# Patient Record
Sex: Female | Born: 1990 | Race: Black or African American | Hispanic: No | Marital: Single | Smoking: Former smoker
Health system: Southern US, Community
[De-identification: ages and names within clinical notes are randomized; demographics above are authoritative.]

## PROBLEM LIST (undated history)

## (undated) HISTORY — PX: FOOT SURGERY: SHX648

## (undated) HISTORY — PX: OTHER SURGICAL HISTORY: SHX169

---

## 2016-01-30 ENCOUNTER — Emergency Department (HOSPITAL_COMMUNITY)
Admission: EM | Admit: 2016-01-30 | Discharge: 2016-01-30 | Disposition: A | Discharge status: Home / Self Care | Payer: BLUE CROSS/BLUE SHIELD | Attending: Emergency Medicine | Admitting: Emergency Medicine

## 2016-01-30 ENCOUNTER — Emergency Department (HOSPITAL_COMMUNITY): Payer: BLUE CROSS/BLUE SHIELD

## 2016-01-30 ENCOUNTER — Encounter (HOSPITAL_COMMUNITY): Payer: Self-pay | Admitting: *Deleted

## 2016-01-30 DIAGNOSIS — F17290 Nicotine dependence, other tobacco product, uncomplicated: Secondary | ICD-10-CM | POA: Insufficient documentation

## 2016-01-30 DIAGNOSIS — N83201 Unspecified ovarian cyst, right side: Secondary | ICD-10-CM | POA: Insufficient documentation

## 2016-01-30 DIAGNOSIS — R300 Dysuria: Secondary | ICD-10-CM | POA: Insufficient documentation

## 2016-01-30 DIAGNOSIS — R1031 Right lower quadrant pain: Secondary | ICD-10-CM

## 2016-01-30 DIAGNOSIS — R109 Unspecified abdominal pain: Secondary | ICD-10-CM

## 2016-01-30 LAB — URINALYSIS, ROUTINE W REFLEX MICROSCOPIC
Bilirubin Urine: NEGATIVE
Glucose, UA: NEGATIVE mg/dL
HGB URINE DIPSTICK: NEGATIVE
Ketones, ur: NEGATIVE mg/dL
Leukocytes, UA: NEGATIVE
NITRITE: NEGATIVE
PROTEIN: NEGATIVE mg/dL
Specific Gravity, Urine: 1.015 (ref 1.005–1.030)
pH: 6.5 (ref 5.0–8.0)

## 2016-01-30 LAB — COMPREHENSIVE METABOLIC PANEL
ALK PHOS: 47 U/L (ref 38–126)
ALT: 11 U/L — AB (ref 14–54)
AST: 16 U/L (ref 15–41)
Albumin: 4.1 g/dL (ref 3.5–5.0)
Anion gap: 5 (ref 5–15)
BILIRUBIN TOTAL: 0.5 mg/dL (ref 0.3–1.2)
BUN: 11 mg/dL (ref 6–20)
CALCIUM: 9.5 mg/dL (ref 8.9–10.3)
CO2: 25 mmol/L (ref 22–32)
CREATININE: 0.71 mg/dL (ref 0.44–1.00)
Chloride: 108 mmol/L (ref 101–111)
Glucose, Bld: 99 mg/dL (ref 65–99)
Potassium: 4 mmol/L (ref 3.5–5.1)
Sodium: 138 mmol/L (ref 135–145)
TOTAL PROTEIN: 7 g/dL (ref 6.5–8.1)

## 2016-01-30 LAB — LIPASE, BLOOD: Lipase: 25 U/L (ref 11–51)

## 2016-01-30 LAB — CBC
HCT: 38.4 % (ref 36.0–46.0)
Hemoglobin: 13 g/dL (ref 12.0–15.0)
MCH: 30.7 pg (ref 26.0–34.0)
MCHC: 33.9 g/dL (ref 30.0–36.0)
MCV: 90.8 fL (ref 78.0–100.0)
PLATELETS: 174 10*3/uL (ref 150–400)
RBC: 4.23 MIL/uL (ref 3.87–5.11)
RDW: 13.5 % (ref 11.5–15.5)
WBC: 7.4 10*3/uL (ref 4.0–10.5)

## 2016-01-30 LAB — POC URINE PREG, ED: PREG TEST UR: NEGATIVE

## 2016-01-30 LAB — I-STAT CG4 LACTIC ACID, ED
Lactic Acid, Venous: 0.5 mmol/L (ref 0.5–1.9)
Lactic Acid, Venous: <0.3 mmol/L — ABNORMAL LOW (ref 0.5–1.9)

## 2016-01-30 MED ORDER — TRAMADOL HCL 50 MG PO TABS
50.0000 mg | ORAL_TABLET | Freq: Four times a day (QID) | ORAL | 0 refills | Status: DC | PRN
Start: 2016-01-30 — End: 2016-03-28

## 2016-01-30 MED ORDER — FENTANYL CITRATE (PF) 100 MCG/2ML IJ SOLN
50.0000 ug | Freq: Once | INTRAMUSCULAR | Status: AC
  Administered 2016-01-30: 50 ug via INTRAVENOUS
  Filled 2016-01-30: qty 2

## 2016-01-30 MED ORDER — IOPAMIDOL (ISOVUE-300) INJECTION 61%
100.0000 mL | Freq: Once | INTRAVENOUS | Status: AC | PRN
  Administered 2016-01-30: 80 mL via INTRAVENOUS

## 2016-01-30 MED ORDER — SODIUM CHLORIDE 0.9 % IV BOLUS (SEPSIS)
1000.0000 mL | Freq: Once | INTRAVENOUS | Status: AC
  Administered 2016-01-30: 1000 mL via INTRAVENOUS

## 2016-01-30 MED ORDER — IOPAMIDOL (ISOVUE-300) INJECTION 61%
30.0000 mL | Freq: Once | INTRAVENOUS | Status: AC | PRN
  Administered 2016-01-30: 30 mL via ORAL

## 2016-01-30 NOTE — Discharge Instructions (Signed)
Please follow-up with your OB/GYN team for further management of your ovarian cyst. Please follow-up with PCP for further pain management. If symptoms worsen, or he develop any allergic reaction to medications, please return to the nearest ED.

## 2016-01-30 NOTE — ED Notes (Signed)
Pt c/o lower abdominal pain starting about 1 hour ago, also reports rectal pain, denies n/v/d, no fever

## 2016-01-30 NOTE — ED Provider Notes (Signed)
[facility]-EMERGENCY DEPT Provider Note   CSN: [id number] Arrival date & time: 01/30/16  [id number]     History   Chief Complaint Chief Complaint  Patient presents with  . Abdominal Pain    HPI Mylinda LatinaHailey Kearse is a 25 y.o. female with no significant past medical history who presents with abdominal pain. Patient reports that she was awoken this morning approximate 1 hour prior to arrival with lower abdominal pain. She says the pain is worse in the right lower quadrant. She has no history of appendicitis or prior abdominal surgery. She reports feeling normal last night going to bed. She had a normal bowel movement last night. She does report some dysuria but denies any change in appearance, smell, or amount of urine. She denies any recent abdominal trauma. She denies any constipation, diarrhea, or rectal bleeding. She denies any vaginal discharge or vaginal bleeding. She denies any nausea, vomiting, fevers, or chills. She denies any chest pain, shortness of breath, cough, or neurologic planes. She has not taken any medicine to help her symptoms. She describes the pain as 7 out of 10, fluctuating in severity up to 10 out of 10, and improved with a bent over position. She denies any recent rectal trauma or any other complaints today.    The history is provided by the patient and a significant other. No language interpreter was used.  Abdominal Pain   This is a new problem. The current episode started less than 1 hour ago. The problem occurs constantly. The problem has not changed since onset.The pain is located in the RLQ and suprapubic region. The quality of the pain is cramping and sharp. The pain is at a severity of 7/10. The pain is severe. Associated symptoms include dysuria. Pertinent negatives include fever, diarrhea, nausea, vomiting, constipation, frequency, hematuria and headaches. The symptoms are aggravated by palpation. The symptoms are relieved by certain positions. Past workup does not include  surgery. Her past medical history does not include PUD, gallstones, GERD, ulcerative colitis, Crohn's disease or irritable bowel syndrome.    History reviewed. No pertinent past medical history.  There are no active problems to display for this patient.   Past Surgical History:  Procedure Laterality Date  . FOOT SURGERY    . skin grafts      OB History    No data available       Home Medications    Prior to Admission medications   Not on File    Family History No family history on file.  Social History Social History  Substance Use Topics  . Smoking status: Current Every Day Smoker    Types: Cigars  . Smokeless tobacco: Never Used  . Alcohol use Yes     Allergies   Oxycontin [oxycodone hcl]   Review of Systems Review of Systems  Constitutional: Negative for activity change, chills, diaphoresis, fatigue and fever.  HENT: Negative for congestion and rhinorrhea.   Eyes: Negative for visual disturbance.  Respiratory: Negative for cough, chest tightness, shortness of breath and stridor.   Cardiovascular: Negative for chest pain, palpitations and leg swelling.  Gastrointestinal: Positive for abdominal pain. Negative for abdominal distention, blood in stool, constipation, diarrhea, nausea and vomiting.  Genitourinary: Positive for dysuria. Negative for decreased urine volume, difficulty urinating, flank pain, frequency, hematuria, menstrual problem, pelvic pain, vaginal bleeding, vaginal discharge and vaginal pain.  Musculoskeletal: Negative for back pain, neck pain and neck stiffness.  Skin: Negative for rash and wound.  Neurological: Negative for dizziness, weakness,  light-headedness, numbness and headaches.  Psychiatric/Behavioral: Negative for agitation and confusion.  All other systems reviewed and are negative.    Physical Exam Updated Vital Signs BP 113/71 (BP Location: Left Arm)   Pulse 81   Temp 98.7 F (37.1 C)   Resp 16   Ht 5\' 3"  (1.6 m)   Wt  110 lb (49.9 kg)   LMP 01/06/2016 (Approximate)   SpO2 100%   BMI 19.49 kg/m   Physical Exam  Constitutional: She appears well-developed and well-nourished. No distress.  HENT:  Head: Normocephalic and atraumatic.  Nose: Nose normal.  Mouth/Throat: Oropharynx is clear and moist. No oropharyngeal exudate.  Eyes: Conjunctivae are normal. Pupils are equal, round, and reactive to light. No scleral icterus.  Neck: Normal range of motion. Neck supple.  Cardiovascular: Normal rate and regular rhythm.   No murmur heard. Pulmonary/Chest: Effort normal and breath sounds normal. No stridor. No respiratory distress. She has no rales. She exhibits no tenderness.  Abdominal: Soft. Normal appearance and bowel sounds are normal. There is tenderness in the right lower quadrant and suprapubic area. There is no rigidity, no rebound and no CVA tenderness.    Genitourinary: Rectal exam shows no external hemorrhoid, no tenderness and anal tone normal. Pelvic exam was performed with patient in the knee-chest position.  Musculoskeletal: She exhibits no edema or tenderness.  Neurological: She is alert. She exhibits normal muscle tone.  Skin: Skin is warm and dry. Capillary refill takes less than 2 seconds.  Psychiatric: She has a normal mood and affect.  Nursing note and vitals reviewed.    ED Treatments / Results  Labs (all labs ordered are listed, but only abnormal results are displayed) Labs Reviewed  COMPREHENSIVE METABOLIC PANEL - Abnormal; Notable for the following:       Result Value   ALT 11 (*)    All other components within normal limits  I-STAT CG4 LACTIC ACID, ED - Abnormal; Notable for the following:    Lactic Acid, Venous <0.30 (*)    All other components within normal limits  LIPASE, BLOOD  CBC  URINALYSIS, ROUTINE W REFLEX MICROSCOPIC (NOT [facility])  POC URINE PREG, ED  I-STAT CG4 LACTIC ACID, ED    EKG  EKG Interpretation None       Radiology [place] Transvaginal  Non-ob  Result Date: 01/30/2016 CLINICAL DATA:  Ultrasound was provided for use by the ordering physician, and a technical charge was applied by the performing facility.  No radiologist interpretation/professional services rendered.   [place] Pelvis Complete  Result Date: 01/30/2016 CLINICAL DATA:  Abdominal pain. EXAM: TRANSABDOMINAL AND TRANSVAGINAL ULTRASOUND OF PELVIS TECHNIQUE: Both transabdominal and transvaginal ultrasound examinations of the pelvis were performed. Transabdominal technique was performed for global imaging of the pelvis including uterus, ovaries, adnexal regions, and pelvic cul-de-sac. It was necessary to proceed with endovaginal exam following the transabdominal exam to visualize the endometrium and ovaries. COMPARISON:  01/30/2016 CT abdomen FINDINGS: Uterus Measurements: 8 x 3.6 x 4.5 cm. No fibroids or other mass visualized. Endometrium Thickness: 9.6 mm.  No focal abnormality visualized. Right ovary Measurements: 3.8 x 2.6 x 3.8 cm. Complex hypoechoic 2.5 x 2.2 x 2.1 cm mass in the right ovary without internal Doppler flow likely reflecting a hemorrhagic cyst or endometrioma. Left ovary Measurements: 4.2 x 1.4 x 3.1 cm. Normal appearance/no adnexal mass. Other findings Trace pelvic free fluid likely physiologic. IMPRESSION: 1. Complex hypoechoic 2.5 x 2.2 x 2.1 cm mass in the right ovary without internal Doppler  flow likely reflecting a hemorrhagic cyst or endometrioma. Electronically Signed   By: Elige Ko   On: 01/30/2016 10:55   Ct Abdomen Pelvis W Contrast  Result Date: 01/30/2016 CLINICAL DATA:  Acute lower abdominal pain. EXAM: CT ABDOMEN AND PELVIS WITH CONTRAST TECHNIQUE: Multidetector CT imaging of the abdomen and pelvis was performed using the standard protocol following bolus administration of intravenous contrast. CONTRAST:  80mL ISOVUE-300 IOPAMIDOL (ISOVUE-300) INJECTION 61% COMPARISON:  None. FINDINGS: Lower chest: Visualized lung bases are unremarkable.  Hepatobiliary: No gallstones are noted.  Liver appears normal. Pancreas: Normal. Spleen: Normal. Adrenals/Urinary Tract: Adrenal glands and kidneys appear normal. No hydronephrosis or renal obstruction is noted. Urinary bladder appears normal. Stomach/Bowel: The appendix appears normal. There is no evidence of bowel obstruction. Vascular/Lymphatic: No significant adenopathy is noted. Reproductive: Uterus and left ovary appear normal. 2 cm peripherally enhancing irregular cyst is noted in right ovary most consistent with ruptured or involuting cyst. Other: Mild amount of free fluid is noted in the right adnexal region and pelvis consistent with ruptured ovarian cyst. There may be some hemorrhage present in the right adnexal region as well. Musculoskeletal: No significant osseous abnormality is noted. IMPRESSION: Probable ruptured or involuting cyst seen in right ovary. Mild amount of free fluid is noted in the right adnexal region and pelvis, as well as possible small amount of hemorrhage present in the right adnexal region. Pelvic ultrasound is recommended for further evaluation. Electronically Signed   By: Lupita Raider, M.D.   On: 01/30/2016 09:36    Procedures Procedures (including critical care time)  Medications Ordered in ED Medications  fentaNYL (SUBLIMAZE) injection 50 mcg (50 mcg Intravenous Given 01/30/16 0747)  sodium chloride 0.9 % bolus 1,000 mL (0 mLs Intravenous Stopped 01/30/16 0837)  iopamidol (ISOVUE-300) 61 % injection 30 mL (30 mLs Oral Contrast Given 01/30/16 0727)  iopamidol (ISOVUE-300) 61 % injection 100 mL (80 mLs Intravenous Contrast Given 01/30/16 0908)     Initial Impression / Assessment and Plan / ED Course  I have reviewed the triage vital signs and the nursing notes.  Pertinent labs & imaging results that were available during my care of the patient were reviewed by me and considered in my medical decision making (see chart for details).  Clinical Course  Value  Comment By Time  [facility]: 30.7 (Reviewed) Canary Brim Tegeler, MD 11/01 [phone]    Janara Klett is a 25 y.o. female with no significant past medical history who presents with abdominal pain.   History and exam are seen above.  Patient's abdominal exam significant for right lower quadrant tenderness and mild suprapubic tenderness. No back tenderness or CVA tenderness. Lungs clear. Rectal exam shows no abscess or hemorrhoid. Patient denies any pelvic pain.  Due to the location of symptoms in the right lower quadrant, patient will have workup including urinalysis, pregnancy test, blood work, and CT scan to look for appendicitis or other intra-abdominal etiology of her pain. Patient will be given fentanyl due to OxyContin intolerance. Patient we given fluids for hydration.  9:54 AM Laboratory testing results showed negative pregnancy test, unremarkable urinalysis, normal lactic acid, normal lipase, unremarkable CMP or CBC.  CT scan showed evidence of right ovarian cyst with possible hemorrhage. Pelvic ultrasound was recommended by radiology. This was subsequently ordered.  Patient was informed of findings and agrees to ultrasound. Patient's pain is improved after medications.   Pelvic ultrasound revealed confirmation of likely ruptured cyst versus endometriosis. Patient informed of these findings and instructed to  follow-up with her OB/GYN team. She reports she will call to try and see them this week. As patient has had complete resolution of symptoms, and with patient's reassuring vital signs and laboratory testing, patient felt appropriate for discharge.   Patient given prescription for pain medicine. Patient given strict return precautions for any new or worsening symptoms. Patient had no other questions or concerns and was discharged in good condition.    Final Clinical Impressions(s) / ED Diagnoses   Final diagnoses:  Abdominal pain  Right lower quadrant abdominal pain  Hemorrhagic cyst  of right ovary    New Prescriptions Discharge Medication List as of 01/30/2016 12:07 PM    START taking these medications   Details  traMADol (ULTRAM) 50 MG tablet Take 1 tablet (50 mg total) by mouth every 6 (six) hours as needed., Starting Wed 01/30/2016, Print        Clinical Impression: 1. Right lower quadrant abdominal pain   2. Abdominal pain   3. Hemorrhagic cyst of right ovary     Disposition: Discharge  Condition: Good  I have discussed the results, Dx and Tx plan with the pt(& family if present). He/she/they expressed understanding and agree(s) with the plan. Discharge instructions discussed at great length. Strict return precautions discussed and pt &/or family have verbalized understanding of the instructions. No further questions at time of discharge.    Discharge Medication List as of 01/30/2016 12:07 PM    START taking these medications   Details  traMADol (ULTRAM) 50 MG tablet Take 1 tablet (50 mg total) by mouth every 6 (six) hours as needed., Starting Wed 01/30/2016, Print        Follow Up: [facility] [address] [place] [address] [phone] Schedule an appointment as soon as possible for a visit    [facility] [address] [phone] [address]c [place] [address] [phone]  If symptoms worsen     Heide Scales, MD 01/30/16 1909

## 2016-01-30 NOTE — ED Notes (Signed)
Patient transported to CT 

## 2016-03-28 ENCOUNTER — Emergency Department (HOSPITAL_COMMUNITY)
Admission: EM | Admit: 2016-03-28 | Discharge: 2016-03-28 | Disposition: A | Discharge status: Home / Self Care | Payer: Self-pay | Attending: Emergency Medicine | Admitting: Emergency Medicine

## 2016-03-28 ENCOUNTER — Encounter (HOSPITAL_COMMUNITY): Payer: Self-pay | Admitting: Emergency Medicine

## 2016-03-28 DIAGNOSIS — Z79899 Other long term (current) drug therapy: Secondary | ICD-10-CM | POA: Insufficient documentation

## 2016-03-28 DIAGNOSIS — Z3A01 Less than 8 weeks gestation of pregnancy: Secondary | ICD-10-CM | POA: Insufficient documentation

## 2016-03-28 DIAGNOSIS — F17200 Nicotine dependence, unspecified, uncomplicated: Secondary | ICD-10-CM | POA: Insufficient documentation

## 2016-03-28 DIAGNOSIS — O99331 Smoking (tobacco) complicating pregnancy, first trimester: Secondary | ICD-10-CM | POA: Insufficient documentation

## 2016-03-28 DIAGNOSIS — J069 Acute upper respiratory infection, unspecified: Secondary | ICD-10-CM | POA: Insufficient documentation

## 2016-03-28 DIAGNOSIS — O99511 Diseases of the respiratory system complicating pregnancy, first trimester: Secondary | ICD-10-CM | POA: Insufficient documentation

## 2016-03-28 DIAGNOSIS — O0281 Inappropriate change in quantitative human chorionic gonadotropin (hCG) in early pregnancy: Secondary | ICD-10-CM | POA: Insufficient documentation

## 2016-03-28 LAB — HCG, QUANTITATIVE, PREGNANCY: HCG, BETA CHAIN, QUANT, S: 29553 m[IU]/mL — AB (ref <5)

## 2016-03-28 MED ORDER — CONCEPT OB 130-92.4-1 MG PO CAPS: 1.0000 | ORAL_CAPSULE | Freq: Every day | ORAL | 12 refills | Status: AC

## 2016-03-28 MED ORDER — ACETAMINOPHEN 500 MG PO TABS
1000.0000 mg | ORAL_TABLET | Freq: Once | ORAL | Status: AC
  Administered 2016-03-28: 1000 mg via ORAL
  Filled 2016-03-28: qty 2

## 2016-03-28 NOTE — Discharge Instructions (Signed)
Your Pregnancy test is positive. You are estimated top be 8565w4d pregnant. Your estimated due date is 11/10/2016.  Please take prenatal vitamins. Avoid cigarettes and alcohol. You may take tylenol only for your cold symptoms.  You may ask a pharmacist at any drug store if there are other medications that are safe for you to take for your cold in early pregnancy.  You appear to have an upper respiratory infection (URI). An upper respiratory tract infection, or cold, is a viral infection of the air passages leading to the lungs. It is contagious and can be spread to others, especially during the first 3 or 4 days. It cannot be cured by antibiotics or other medicines. RETURN IMMEDIATELY IF you develop shortness of breath, confusion or altered mental status, a new rash, become dizzy, faint, or poorly responsive, or are unable to be cared for at home.

## 2016-03-28 NOTE — ED Provider Notes (Signed)
[facility]-EMERGENCY DEPT Provider Note   CSN: [id number] Arrival date & time: 03/28/16  0746     History   Chief Complaint Chief Complaint  Patient presents with  . Generalized Body Aches  . Possible Pregnancy    HPI Lindsey Doyle is a 25 y.o. female who presents to the ED with Symptoms of URI and concern for possible pregnancy. The patient's last menstrual period was 02/04/2016. The patient states that she did have a positive home pregnancy test. Patient also complains of onset of symptoms of URI yesterday. She developed a scratchy throat, cough, body aches. She states that she felt subjectively febrile last night and took over-the-counter pain relieving medication which helped. She denies wheezing, chest tightness.  HPI  History reviewed. No pertinent past medical history.  There are no active problems to display for this patient.   Past Surgical History:  Procedure Laterality Date  . FOOT SURGERY    . skin grafts      OB History    No data available       Home Medications    Prior to Admission medications   Medication Sig Start Date End Date Taking? Authorizing Provider  ORSYTHIA 0.1-20 MG-MCG tablet Take 1 tablet by mouth daily. 12/25/15   Historical Provider, MD  traMADol (ULTRAM) 50 MG tablet Take 1 tablet (50 mg total) by mouth every 6 (six) hours as needed. 01/30/16   Heide Scaleshristopher J Tegeler, MD    Family History No family history on file.  Social History Social History  Substance Use Topics  . Smoking status: Current Some Day Smoker  . Smokeless tobacco: Never Used  . Alcohol use No     Allergies   Oxycontin [oxycodone hcl]   Review of Systems Review of Systems  Ten systems reviewed and are negative for acute change, except as noted in the HPI.   Physical Exam Updated Vital Signs BP 117/73 (BP Location: Left Arm)   Pulse 103   Temp 98.8 F (37.1 C) (Oral)   Resp 18   Wt 49.9 kg   LMP 02/04/2016   SpO2 100%   BMI 19.49 kg/m   Physical  Exam  Constitutional: She is oriented to person, place, and time. She appears well-developed and well-nourished. No distress.  HENT:  Head: Normocephalic and atraumatic.  Mild pharyngeal erythema without exudate Nasal and facial congestion TMs normal bilaterally. No tonsillar or cervical adenopathy  Eyes: Conjunctivae are normal. No scleral icterus.  Neck: Normal range of motion.  Cardiovascular: Normal rate, regular rhythm and normal heart sounds.  Exam reveals no gallop and no friction rub.   No murmur heard. Pulmonary/Chest: Effort normal and breath sounds normal. No respiratory distress. She has no wheezes.  Dry cough  Abdominal: Soft. Bowel sounds are normal. She exhibits no distension and no mass. There is no tenderness. There is no guarding.  Neurological: She is alert and oriented to person, place, and time.  Skin: Skin is warm and dry. She is not diaphoretic.  Nursing note and vitals reviewed.       ED Treatments / Results  Labs (all labs ordered are listed, but only abnormal results are displayed) Labs Reviewed  HCG, QUANTITATIVE, PREGNANCY    EKG  EKG Interpretation None       Radiology No results found.  Procedures Procedures (including critical care time)  Medications Ordered in ED Medications  acetaminophen (TYLENOL) tablet 1,000 mg (not administered)     Initial Impression / Assessment and Plan / ED Course  I have reviewed the triage vital signs and the nursing notes.  Pertinent labs & imaging results that were available during my care of the patient were reviewed by me and considered in my medical decision making (see chart for details).  Clinical Course as of Mar 28 1042  Fri Mar 28, 2016  1043 HCG, Newman NickelsBeta Chain, Quant, S: (!) [phone] [AH]    Clinical Course User Index [AH] Arthor CaptainAbigail Jhaniya Briski, PA-C    Patient with symptoms of URI. She will use Tylenol for symptom treatment. Her quantitative hCG is 30,000. She had this and estimated gestational  age of [redacted] weeks 4 days with an estimated delivery date 11/10/2016. Patient given a prescription for prenatal vitamins. Advised to avoid alcohol, ATC medications without consultation. A pharmacist, drugs or cigarettes. The patient appears safe for discharge at this time.  Final Clinical Impressions(s) / ED Diagnoses   Final diagnoses:  Less than [redacted] weeks gestation of pregnancy  Viral upper respiratory tract infection    New Prescriptions Discharge Medication List as of 03/28/2016 10:51 AM    START taking these medications   Details  Prenat w/o A Vit-FeFum-FePo-FA (CONCEPT OB) 130-92.4-1 MG CAPS Take 1 capsule by mouth daily., Starting Fri 03/28/2016, Print         Arthor CaptainAbigail Breunna Nordmann, PA-C 03/28/16 1638    Rolland PorterMark James, MD 04/04/16 0400

## 2016-03-28 NOTE — ED Triage Notes (Signed)
Pt complaint of non productive cough, sore throat, nausea, and generalized body aches. Pt continues to report "positive pregnancy test on a stick" but does not have OBGYN related to insurance problems.

## 2016-04-02 ENCOUNTER — Encounter (HOSPITAL_COMMUNITY): Payer: Self-pay | Admitting: Emergency Medicine

## 2016-04-02 ENCOUNTER — Emergency Department (HOSPITAL_COMMUNITY)
Admission: EM | Admit: 2016-04-02 | Discharge: 2016-04-03 | Disposition: A | Discharge status: Home / Self Care | Payer: Medicaid Other | Attending: Emergency Medicine | Admitting: Emergency Medicine

## 2016-04-02 ENCOUNTER — Emergency Department (HOSPITAL_COMMUNITY): Payer: Medicaid Other

## 2016-04-02 DIAGNOSIS — Z3A01 Less than 8 weeks gestation of pregnancy: Secondary | ICD-10-CM

## 2016-04-02 DIAGNOSIS — O0281 Inappropriate change in quantitative human chorionic gonadotropin (hCG) in early pregnancy: Secondary | ICD-10-CM | POA: Insufficient documentation

## 2016-04-02 DIAGNOSIS — O99331 Smoking (tobacco) complicating pregnancy, first trimester: Secondary | ICD-10-CM | POA: Diagnosis not present

## 2016-04-02 DIAGNOSIS — K219 Gastro-esophageal reflux disease without esophagitis: Secondary | ICD-10-CM

## 2016-04-02 DIAGNOSIS — K279 Peptic ulcer, site unspecified, unspecified as acute or chronic, without hemorrhage or perforation: Secondary | ICD-10-CM | POA: Insufficient documentation

## 2016-04-02 DIAGNOSIS — R109 Unspecified abdominal pain: Secondary | ICD-10-CM

## 2016-04-02 DIAGNOSIS — Z79899 Other long term (current) drug therapy: Secondary | ICD-10-CM | POA: Diagnosis not present

## 2016-04-02 DIAGNOSIS — F17200 Nicotine dependence, unspecified, uncomplicated: Secondary | ICD-10-CM | POA: Insufficient documentation

## 2016-04-02 DIAGNOSIS — Z3A08 8 weeks gestation of pregnancy: Secondary | ICD-10-CM | POA: Diagnosis not present

## 2016-04-02 DIAGNOSIS — O99611 Diseases of the digestive system complicating pregnancy, first trimester: Secondary | ICD-10-CM | POA: Diagnosis not present

## 2016-04-02 DIAGNOSIS — Z3490 Encounter for supervision of normal pregnancy, unspecified, unspecified trimester: Secondary | ICD-10-CM

## 2016-04-02 DIAGNOSIS — R198 Other specified symptoms and signs involving the digestive system and abdomen: Secondary | ICD-10-CM

## 2016-04-02 LAB — COMPREHENSIVE METABOLIC PANEL
ALBUMIN: 4.1 g/dL (ref 3.5–5.0)
ALT: 14 U/L (ref 14–54)
AST: 18 U/L (ref 15–41)
Alkaline Phosphatase: 38 U/L (ref 38–126)
Anion gap: 8 (ref 5–15)
BILIRUBIN TOTAL: 0.2 mg/dL — AB (ref 0.3–1.2)
BUN: 6 mg/dL (ref 6–20)
CALCIUM: 8.9 mg/dL (ref 8.9–10.3)
CO2: 22 mmol/L (ref 22–32)
Chloride: 103 mmol/L (ref 101–111)
Creatinine, Ser: 0.56 mg/dL (ref 0.44–1.00)
GFR calc Af Amer: >60 mL/min (ref >60)
GFR calc non Af Amer: >60 mL/min (ref >60)
GLUCOSE: 93 mg/dL (ref 65–99)
Potassium: 3.9 mmol/L (ref 3.5–5.1)
Sodium: 133 mmol/L — ABNORMAL LOW (ref 135–145)
TOTAL PROTEIN: 7 g/dL (ref 6.5–8.1)

## 2016-04-02 LAB — CBC
HEMATOCRIT: 34.5 % — AB (ref 36.0–46.0)
Hemoglobin: 11.9 g/dL — ABNORMAL LOW (ref 12.0–15.0)
MCH: 29.5 pg (ref 26.0–34.0)
MCHC: 34.5 g/dL (ref 30.0–36.0)
MCV: 85.4 fL (ref 78.0–100.0)
Platelets: 176 10*3/uL (ref 150–400)
RBC: 4.04 MIL/uL (ref 3.87–5.11)
RDW: 13.2 % (ref 11.5–15.5)
WBC: 6.3 10*3/uL (ref 4.0–10.5)

## 2016-04-02 LAB — I-STAT BETA HCG BLOOD, ED (MC, WL, AP ONLY): I-stat hCG, quantitative: >2000 m[IU]/mL — ABNORMAL HIGH (ref <5)

## 2016-04-02 LAB — LIPASE, BLOOD: Lipase: 22 U/L (ref 11–51)

## 2016-04-02 LAB — URINALYSIS, ROUTINE W REFLEX MICROSCOPIC
BILIRUBIN URINE: NEGATIVE
Glucose, UA: NEGATIVE mg/dL
HGB URINE DIPSTICK: NEGATIVE
Ketones, ur: 20 mg/dL — AB
Leukocytes, UA: NEGATIVE
NITRITE: NEGATIVE
PH: 7 (ref 5.0–8.0)
Protein, ur: NEGATIVE mg/dL
Specific Gravity, Urine: 1.02 (ref 1.005–1.030)

## 2016-04-02 LAB — HCG, QUANTITATIVE, PREGNANCY: HCG, BETA CHAIN, QUANT, S: 49702 m[IU]/mL — AB (ref <5)

## 2016-04-02 MED ORDER — SODIUM CHLORIDE 0.9 % IV BOLUS (SEPSIS)
1000.0000 mL | Freq: Once | INTRAVENOUS | Status: AC
  Administered 2016-04-02: 1000 mL via INTRAVENOUS

## 2016-04-02 MED ORDER — SUCRALFATE 1 G PO TABS
1.0000 g | ORAL_TABLET | Freq: Once | ORAL | Status: AC
  Administered 2016-04-03: 1 g via ORAL
  Filled 2016-04-02: qty 1

## 2016-04-02 NOTE — ED Triage Notes (Addendum)
Patient reports continuous generalized abdominal "burning" x1 week. Denies N/V/D. States burning increases "with hunger" and decreases right after eating. Patient also reports she is eight weeks pregnant.

## 2016-04-02 NOTE — ED Provider Notes (Signed)
[facility]-EMERGENCY DEPT Provider Note   CSN: [id number] Arrival date & time: 04/02/16  2022   By signing my name below, I, Clarisse GougeXavier Herndon, attest that this documentation has been prepared under the direction and in the presence of TXU CorpHannah Yajaira Doffing, PA-C. Electronically Signed: Clarisse GougeXavier Herndon, Scribe. 04/02/16. 12:22 AM.   History   Chief Complaint Chief Complaint  Patient presents with  . Abdominal Pain   The history is provided by the patient, medical records and a significant other. No language interpreter was used.   HPI Comments: Lindsey Doyle is a 26 y.o. female who presents to the Emergency Department complaining of waxing/ waning, burning abdominal pain x 6 days.  She reports pain radiates throughout her entire abdomen but is worst in her epigastrium. She reports her pain is currently 8/10, and mild cough noted. (Patient was recently treated for URI and cough has been persistent then but other symptoms have resolved.)  Pt states she is ~[redacted] weeks pregnant, and has not yet had an US. Further notes she has not been seen for prenatal care because she does not have insurance.  She notes her pain is mildly relieved after eating, and worse when her stomach is empty. She states she has attempted to treat her abdominal pain with milk and OTC pain medications with mild relief.  Pt denies urinary sx's, vaginal bleeding, vaginal discharge, nausea, vomiting, diarrhea, melena, hematochezia, Hx of ulcers. Unknown blood type. G2P0010  History reviewed. No pertinent past medical history.  There are no active problems to display for this patient.   Past Surgical History:  Procedure Laterality Date  . FOOT SURGERY    . skin grafts      OB History    Gravida Para Term Preterm AB Living   1             SAB TAB Ectopic Multiple Live Births                   Home Medications    Prior to Admission medications   Medication Sig Start Date End Date Taking? Authorizing Provider  acetaminophen  (TYLENOL) 500 MG tablet Take 1,000 mg by mouth every 6 (six) hours as needed for mild pain, moderate pain, fever or headache.   Yes Historical Provider, MD  Prenat w/o A Vit-FeFum-FePo-FA (CONCEPT OB) 130-92.4-1 MG CAPS Take 1 capsule by mouth daily. 03/28/16  Yes Arthor CaptainAbigail Harris, PA-C  famotidine (PEPCID) 20 MG tablet Take 1 tablet (20 mg total) by mouth 2 (two) times daily. 04/03/16   Lemar Bakos, PA-C  sucralfate (CARAFATE) 1 g tablet Take 1 tablet (1 g total) by mouth 4 (four) times daily -  with meals and at bedtime. 04/03/16   Dahlia ClientHannah Saniyyah Elster, PA-C    Family History History reviewed. No pertinent family history.  Social History Social History  Substance Use Topics  . Smoking status: Current Some Day Smoker  . Smokeless tobacco: Never Used  . Alcohol use No     Allergies   Oxycontin [oxycodone hcl]   Review of Systems Review of Systems  Respiratory: Positive for cough.   Gastrointestinal: Positive for abdominal pain.  Genitourinary: Negative.  Negative for dysuria, hematuria, vaginal bleeding and vaginal discharge.  All other systems reviewed and are negative.    Physical Exam Updated Vital Signs BP 110/71 (BP Location: Right Arm)   Pulse 75   Temp 98.5 F (36.9 C) (Oral)   Resp 19   Ht 5\' 3"  (1.6 m)   Wt 110 lb (  49.9 kg)   LMP 01/14/2016   SpO2 99%   BMI 19.49 kg/m   Physical Exam  Constitutional: She appears well-developed and well-nourished.  HENT:  Head: Normocephalic and atraumatic.  Mouth/Throat: Oropharynx is clear and moist.  Eyes: Conjunctivae are normal. No scleral icterus.  Cardiovascular: Normal rate, regular rhythm and intact distal pulses.   Pulmonary/Chest: Effort normal and breath sounds normal.  Abdominal: Soft. Bowel sounds are normal. She exhibits no distension and no mass. There is tenderness (Very mild) in the epigastric area. There is no rigidity, no rebound, no guarding, no CVA tenderness, no tenderness at McBurney's point and  negative Murphy's sign.  Neurological: She is alert.  Skin: Skin is warm and dry.  Psychiatric: She has a normal mood and affect.  Nursing note and vitals reviewed.    ED Treatments / Results  DIAGNOSTIC STUDIES: Oxygen Saturation is 99% on RA, normal by my interpretation.    COORDINATION OF CARE: 12:22 AM Discussed treatment plan with pt at bedside and pt agreed to plan.  Labs (all labs ordered are listed, but only abnormal results are displayed) Labs Reviewed  COMPREHENSIVE METABOLIC PANEL - Abnormal; Notable for the following:       Result Value   Sodium 133 (*)    Total Bilirubin 0.2 (*)    All other components within normal limits  CBC - Abnormal; Notable for the following:    Hemoglobin 11.9 (*)    HCT 34.5 (*)    All other components within normal limits  URINALYSIS, ROUTINE W REFLEX MICROSCOPIC - Abnormal; Notable for the following:    APPearance HAZY (*)    Ketones, ur 20 (*)    All other components within normal limits  HCG, QUANTITATIVE, PREGNANCY - Abnormal; Notable for the following:    hCG, Beta Chain, Quant, S 16,109 (*)    All other components within normal limits  I-STAT BETA HCG BLOOD, ED ([facility], [facility], [facility] ONLY) - Abnormal; Notable for the following:    I-stat hCG, quantitative >2,000.0 (*)    All other components within normal limits  LIPASE, BLOOD    Radiology US Ob Comp < 14 Wks  Result Date: 04/02/2016 CLINICAL DATA:  Epigastric burning for 1 week EXAM: OBSTETRIC <14 WK ULTRASOUND TECHNIQUE: Transabdominal ultrasound was performed for evaluation of the gestation as well as the maternal uterus and adnexal regions. COMPARISON:  01/30/2016 FINDINGS: Intrauterine gestational sac: Single intrauterine gestational sac. Yolk sac:  Visualized Embryo:  Visualized Cardiac Activity: Visualized Heart Rate: 135 bpm CRL:   9   mm   6 w 6 d                  [place] EDC: 11/20/2016 Subchorionic hemorrhage: Small subchorionic hemorrhage at the fundal portion of the sac. Maternal  uterus/adnexae: Bilateral ovaries are within normal limits. The right ovary measures 3.6 x 2.2 x 2.5 cm and the left ovary measures 3.7 x 2.6 x 2.5 cm. No free fluid. IMPRESSION: Single viable intrauterine gestation as above. Small subchorionic hemorrhage. Electronically Signed   By: Jasmine Pang M.D.   On: 04/02/2016 23:29    Procedures Procedures (including critical care time)  Medications Ordered in ED Medications  sodium chloride 0.9 % bolus 1,000 mL (0 mLs Intravenous Stopped 04/02/16 2303)  sucralfate (CARAFATE) tablet 1 g (1 g Oral Given 04/03/16 0007)     Initial Impression / Assessment and Plan / ED Course  I have reviewed the triage vital signs and the nursing notes.  Pertinent labs & imaging results that were available during my care of the patient were reviewed by me and considered in my medical decision making (see chart for details).  Will order [place], IV and urinalysis. Will prepare to treat for possible ulcer. Clinical Course as of Apr 03 20  Wed Apr 02, 2016  2335 Discussed with Waynetta Sandy, [facility] who recommends Pepcid and Carafate; does not recommend PPI as it is a category C medication.    [HM]  2336 [place] with IUP US OB Comp < 14 Wks [HM]    Clinical Course User Index [HM] Dierdre Forth, PA-C    Patient presents with epigastric abdominal pain most consistent with peptic ulcer vs GERD.  Abdomen is benign. Ultrasound reveals IUP. Urinalysis without evidence of urinary tract infection. Mild dehydration and patient given fluids. Labs are reassuring.  After discussion with pharmacy patient will be discharged home with Pepcid and Carafate. She is eating here in the department and tolerating by mouth without difficulty. Pain improves with eating. Discussed and encouraged small meals throughout the day. Medications as needed for symptoms. She is to follow with her OB/GYN within 1 week for further evaluation. Discussed reasons to return to the emergency department including worsening  pain, vaginal bleeding, vaginal discharge or other concerns.  Final Clinical Impressions(s) / ED Diagnoses   Final diagnoses:  Pregnancy  Abdominal pain  Less than [redacted] weeks gestation of pregnancy  Gastroesophageal reflux disease, esophagitis presence not specified  Peptic ulcer symptoms    New Prescriptions New Prescriptions   FAMOTIDINE (PEPCID) 20 MG TABLET    Take 1 tablet (20 mg total) by mouth 2 (two) times daily.   SUCRALFATE (CARAFATE) 1 G TABLET    Take 1 tablet (1 g total) by mouth 4 (four) times daily -  with meals and at bedtime.    I personally performed the services described in this documentation, which was scribed in my presence. The recorded information has been reviewed and is accurate.    Dahlia Client Lamar Naef, PA-C 04/03/16 0024    Lyndal Pulley, MD 04/03/16 [phone]

## 2016-04-03 MED ORDER — FAMOTIDINE 20 MG PO TABS: 20.0000 mg | ORAL_TABLET | Freq: Two times a day (BID) | ORAL | 0 refills | Status: AC

## 2016-04-03 MED ORDER — SUCRALFATE 1 G PO TABS: 1.0000 g | ORAL_TABLET | Freq: Three times a day (TID) | ORAL | 0 refills | Status: AC

## 2016-04-03 NOTE — Discharge Instructions (Signed)
1. Medications: Carafate, Pepcid, usual home medications 2. Treatment: rest, drink plenty of fluids, eat small meals numerous times throughout the day, try not to have an empty stomach 3. Follow Up: Please followup with your primary doctor in 1 week for discussion of your diagnoses and further evaluation after today's visit; if you do not have a primary care doctor use the resource guide provided to find one; Please return to the ER for persistent vomiting, high fevers or worsening symptoms

## 2016-07-08 ENCOUNTER — Other Ambulatory Visit (HOSPITAL_COMMUNITY): Payer: Self-pay | Admitting: Obstetrics and Gynecology

## 2016-07-08 DIAGNOSIS — R9389 Abnormal findings on diagnostic imaging of other specified body structures: Secondary | ICD-10-CM

## 2016-07-17 ENCOUNTER — Ambulatory Visit (HOSPITAL_COMMUNITY)
Admission: RE | Admit: 2016-07-17 | Discharge: 2016-07-17 | Disposition: A | Discharge status: Home / Self Care | Payer: BLUE CROSS/BLUE SHIELD | Source: Ambulatory Visit | Attending: Obstetrics and Gynecology | Admitting: Obstetrics and Gynecology

## 2016-07-17 ENCOUNTER — Encounter (HOSPITAL_COMMUNITY): Payer: Self-pay

## 2016-07-17 DIAGNOSIS — O288 Other abnormal findings on antenatal screening of mother: Secondary | ICD-10-CM | POA: Diagnosis not present

## 2016-07-17 DIAGNOSIS — R9389 Abnormal findings on diagnostic imaging of other specified body structures: Secondary | ICD-10-CM

## 2016-07-17 DIAGNOSIS — O283 Abnormal ultrasonic finding on antenatal screening of mother: Secondary | ICD-10-CM | POA: Insufficient documentation

## 2016-07-17 DIAGNOSIS — Z363 Encounter for antenatal screening for malformations: Secondary | ICD-10-CM | POA: Diagnosis present

## 2016-07-17 DIAGNOSIS — Z3A22 22 weeks gestation of pregnancy: Secondary | ICD-10-CM | POA: Diagnosis not present

## 2016-07-17 NOTE — Progress Notes (Signed)
Genetic Counseling  High-Risk Gestation Note  Appointment Date:  07/17/2016 Referred By: Edwinna Areola, * Date of Birth:  01-Dec-1990   Pregnancy History: G2P0010 Estimated Date of Delivery: 11/20/16 Estimated Gestational Age: [redacted]w[redacted]d Attending: Particia Nearing, MD  Ms. Larysa Pall was seen for genetic counseling because of ultrasound findings.    In summary:  Reviewed ultrasound findings and the association with fetal aneuploidy  EIF and absent nasal bone  Down syndrome risk approximately 1 in 104 (1%), adjusted from patient's a priori age related risk  Offered additional screening  NIPS-declined  Discussed option of diagnostic testing  Amniocentesis-declined  Reviewed ACOG carrier screening options  Reviewed family history concerns  [facility]. Micki Cassel [facility]) given that previous ultrasound visualized soft markers for fetal aneuploidy at her OB office.  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 The ultrasound report will be sent under separate cover.  We discussed that the second trimester genetic sonogram is targeted at identifying features associated with aneuploidy.  It has evolved as a screening tool used to provide an individualized risk assessment for Down syndrome and other trisomies.  The ability of sonography to aid in the detection of aneuploidies relies on identification of both major structural anomalies and "soft markers."  The patient was counseled that the latter term refers to findings that are often normal variants and do not cause any significant medical problems.  Nonetheless, these markers have a known association with aneuploidy.    The patient was counseled that an EIF is characterized by calcified papillary muscle leading to a discreet dot in the left, or less commonly, the right ventricle.  We discussed that this finding is typically considered to be a benign  variant; however, the risk of aneuploidy is increased when this marker is found in patients who have additional risk factors for fetal aneuploidy (AMA, abnormal screening test, or other markers or anomalies by fetal ultrasound).   Ms. Swalley was counseled that an absent nasal bone is a highly sensitive and specific marker for Down syndrome.  It is present in approximately 1% of chromosomally normal fetuses, but up to 70% of fetuses with Down syndrome, 55 % with trisomy 18, and 34% with trisomy 13. The presence and length of the fetal nasal bone varies by race and ethnicity in second trimester fetuses.  The associated risk of aneuploidy is highest in Caucasians, with a likelihood ratio of approximately 50, in African Americans the likelihood ratio is approximately 8.  Given Ms. Walkup's maternal age of 26 y.o., we discussed the approximate 1 in 60 risk for fetal Down syndrome.  Considering the ultrasound findings of an absent nasal bone and EIF, the adjusted risk for fetal Down syndrome is approximately 1 in 104 (~1%).   We reviewed chromosomes, nondisjunction, and the common features and variable prognosis of Down syndrome.  We also reviewed other aneuploidies including trisomies 13 and 32; however, we discussed that these conditions are less likely given that the remainder of the fetal anatomy was within normal limits by ultrasound.  We reviewed other available screening and diagnostic options including noninvasive prenatal screening (NIPS)/cell free DNA (cfDNA) screening and amniocentesis.  She was counseled regarding the benefits and limitations of each option.  We reviewed the approximate 1 in 300-500 risk for complications for amniocentesis, including spontaneous pregnancy loss. We discussed the possible results that the tests might provide including: positive, negative, unanticipated, and no result. Finally, they were  counseled regarding the cost of each option and potential out of pocket expenses. After  consideration of all the options, she declined NIPS and amniocentesis, stating that she was comfortable with the current risk assessment at this time. She understands that ultrasound cannot diagnose or rule out chromosome conditions and does not diagnose or rule out all birth defects or genetic conditions.   Ms. Keishla Oyer was provided with written information regarding cystic fibrosis (CF), spinal muscular atrophy (SMA) and hemoglobinopathies including the carrier frequency, availability of carrier screening and prenatal diagnosis if indicated.  In addition, we discussed that CF and hemoglobinopathies are routinely screened for as part of the [place] newborn screening panel.  She declined screening for CF, SMA and hemoglobinopathies.  Both family histories were reviewed and found to be contributory for a maternal distant cousin to the father of the pregnancy with intellectual disability. The patient did not have information regarding the exact degree of relation nor the etiology. We discussed that additional information is needed regarding the etiology in order to accurately assess recurrence risk for relatives. The father of the pregnancy does not have information regarding his father or his father's family history. We, therefore, cannot comment on how his history might contribute to the overall chance for the baby to have a birth defect.  Without further information regarding the provided family history, an accurate genetic risk cannot be calculated. Further genetic counseling is warranted if more information is obtained.  Ms. Wilhelmenia Addis denied exposure to environmental toxins or chemical agents. She denied the use of alcohol, tobacco or street drugs. She denied significant viral illnesses during the course of her pregnancy. Her medical and surgical histories were noncontributory.   I counseled Ms. Mylinda Latina regarding the above risks and available options.  The approximate face-to-face time with  the genetic counselor was 40 minutes.  Quinn Plowman, MS,  Certified Genetic Counselor 07/17/2016

## 2016-08-01 ENCOUNTER — Encounter (HOSPITAL_COMMUNITY): Payer: Self-pay

## 2017-02-12 ENCOUNTER — Encounter (HOSPITAL_COMMUNITY): Payer: Self-pay

## 2017-10-03 IMAGING — CT CT ABD-PELV W/ CM
2 of 4 series · 17 of 46 positions shown, 19 images · IV contrast (ISOVUE)
Comparison: None.

CLINICAL DATA: Acute lower abdominal pain.

EXAM:
CT ABDOMEN AND PELVIS WITH CONTRAST
TECHNIQUE: Multidetector CT imaging of the abdomen and pelvis was performed
using the standard protocol following bolus administration of
intravenous contrast.
CONTRAST:  80mL ZMXVIU-2UU IOPAMIDOL (ZMXVIU-2UU) INJECTION 61%

[Series 2: abd/pel with · axial · 0.62mm/px · z∈[+1206,+1560]mm · 14 of 79 slices shown, 16 images]
[im 4/79  soft-tissue]
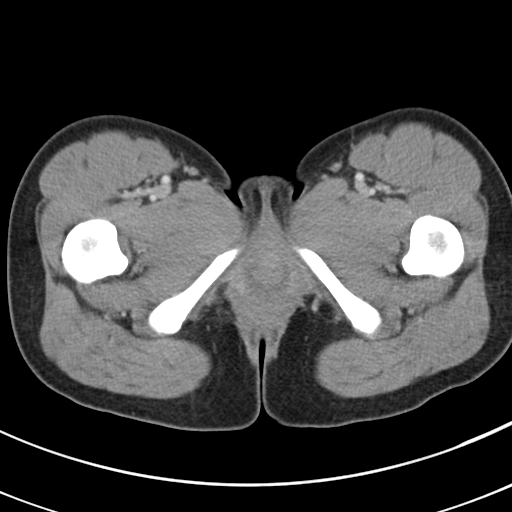
[im 4/79  bone]
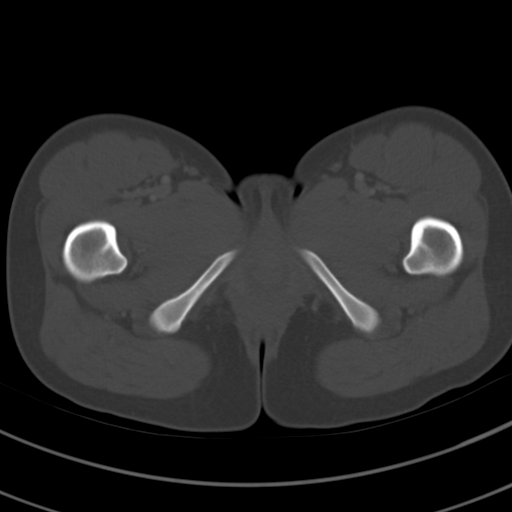
[im 12/79  soft-tissue]
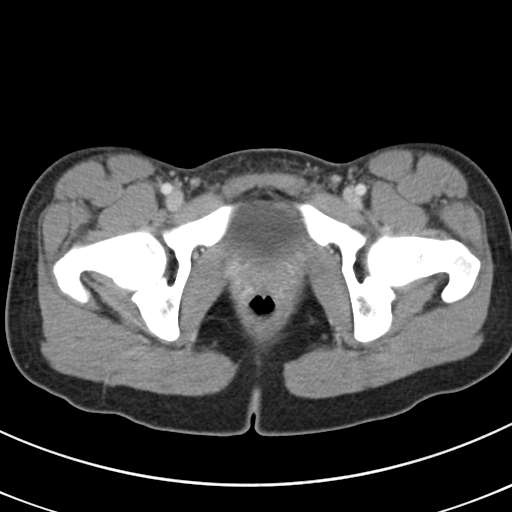
[im 16/79  soft-tissue]
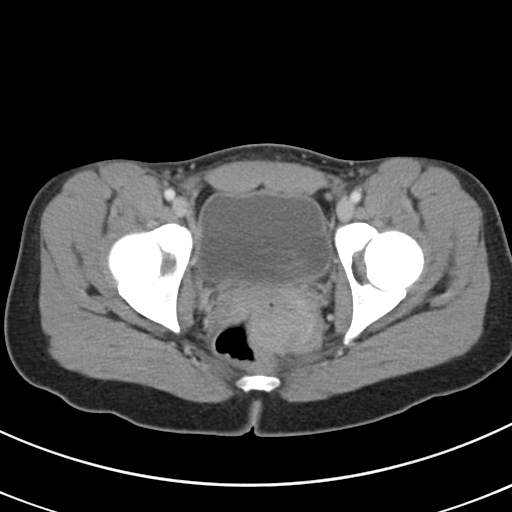
[im 20/79  soft-tissue]
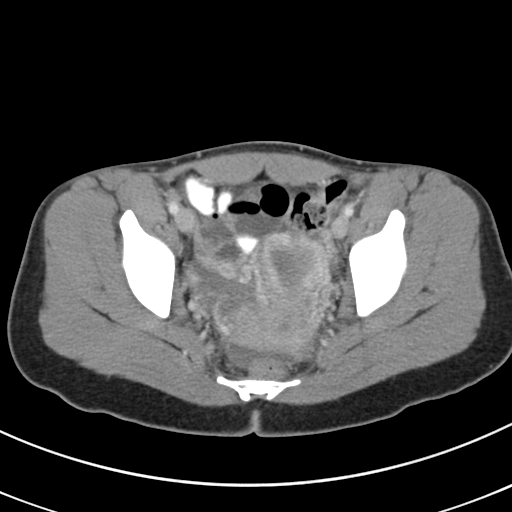
[im 28/79  soft-tissue]
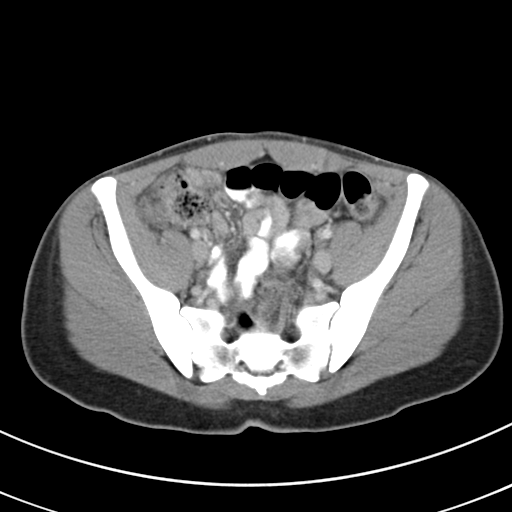
[im 32/79  soft-tissue]
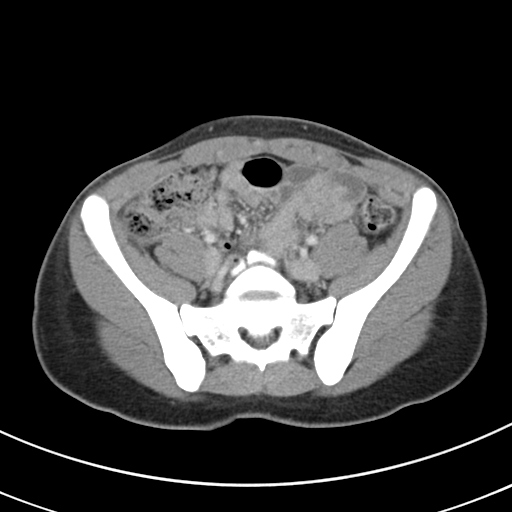
[im 36/79  soft-tissue]
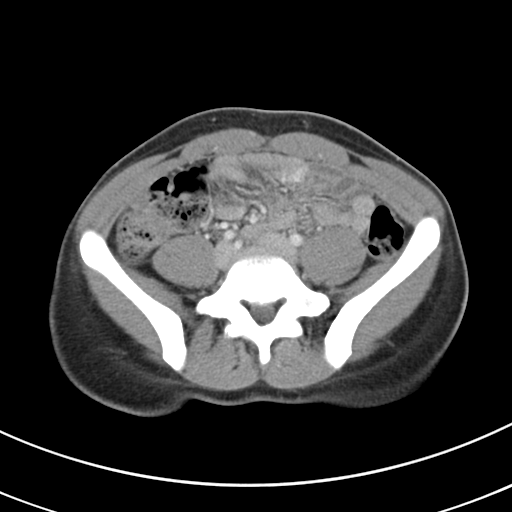
[im 43/79  soft-tissue]
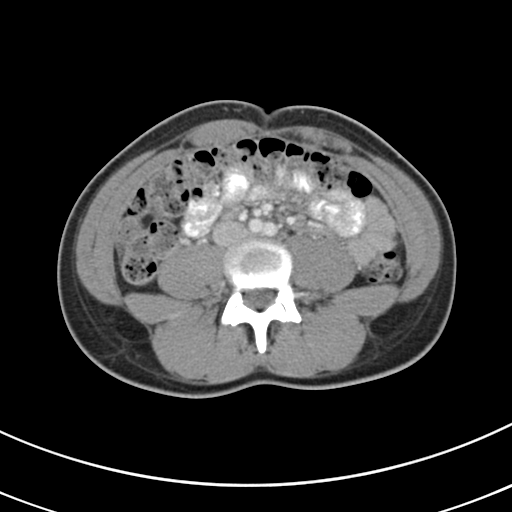
[im 47/79  soft-tissue]
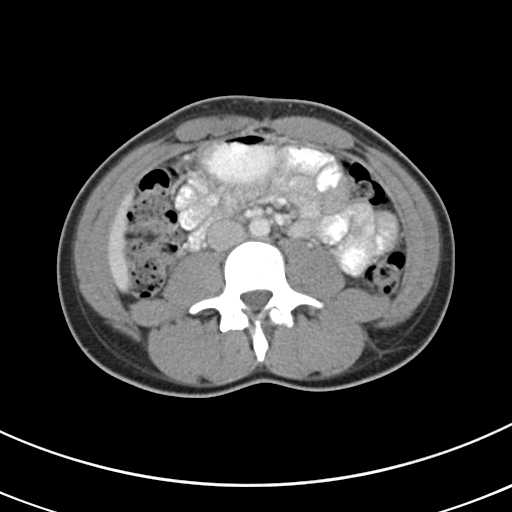
[im 47/79  bone]
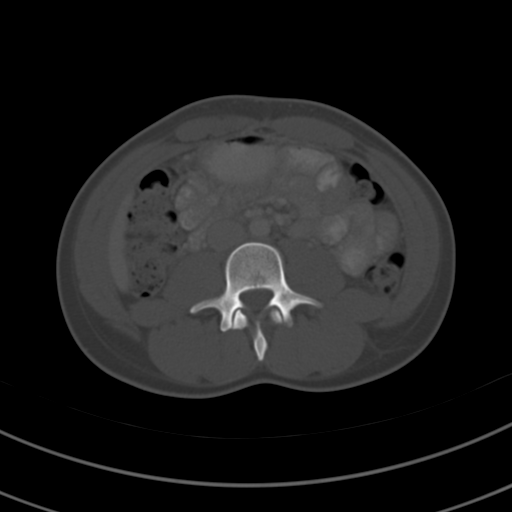
[im 51/79  soft-tissue]
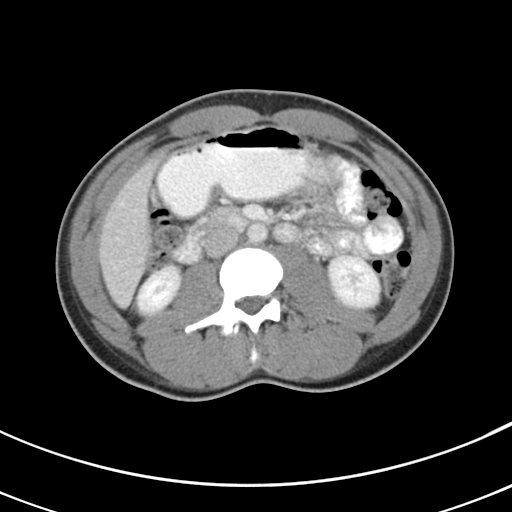
[im 59/79  soft-tissue]
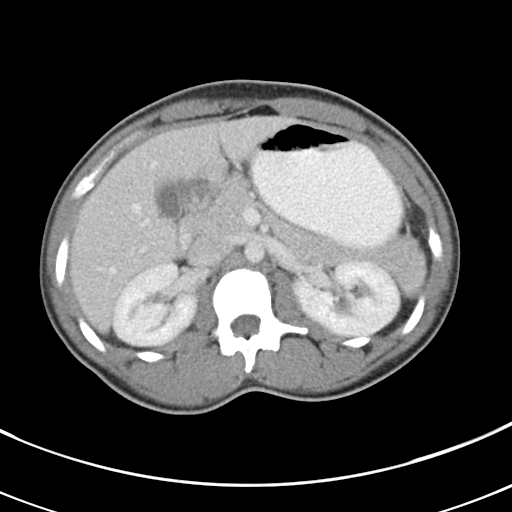
[im 63/79  soft-tissue]
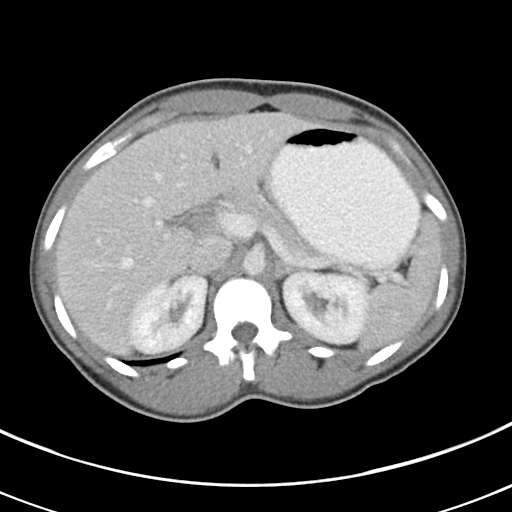
[im 67/79  soft-tissue]
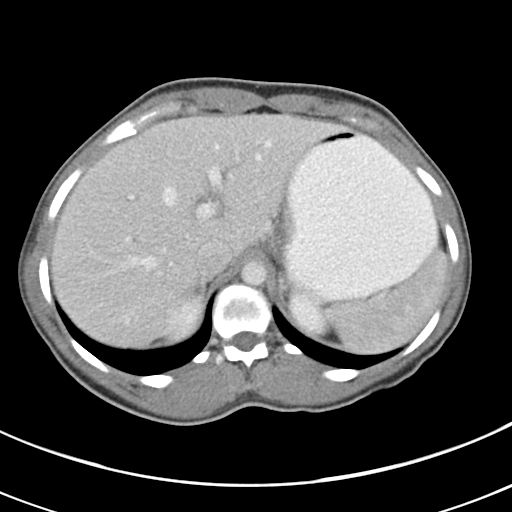
[im 75/79  soft-tissue]
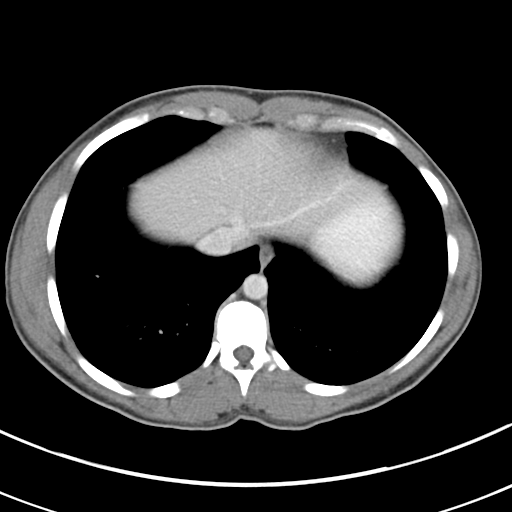

[Series 5: coronal a/|p · coronal · 0.69mm/px · 3 of 110 slices shown]
[im 37/110  soft-tissue]
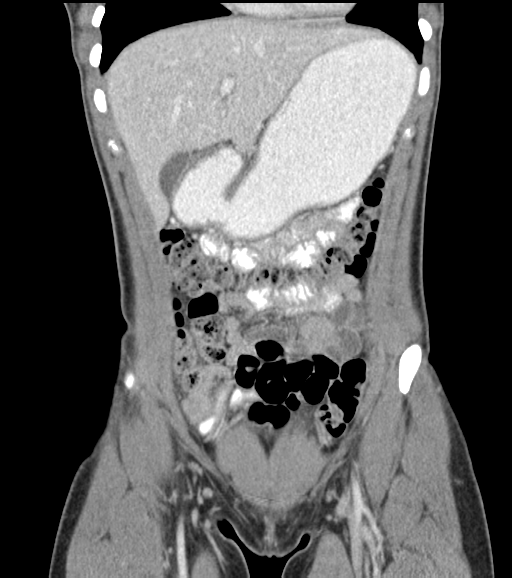
[im 49/110  soft-tissue]
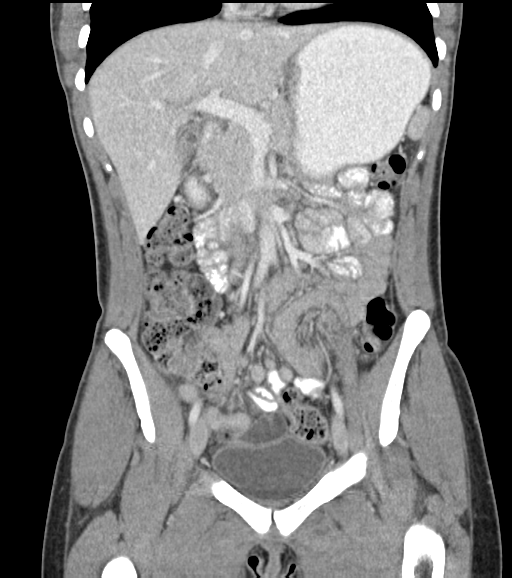
[im 61/110  soft-tissue]
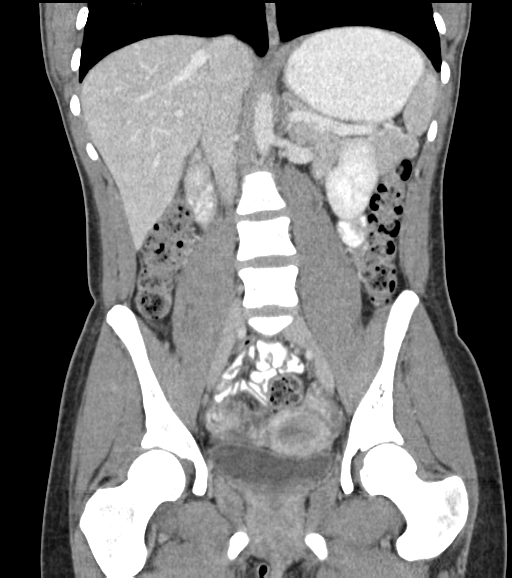

[17 of 46 positions shown; findings below may reference images not displayed]

FINDINGS: Lower chest: Visualized lung bases are unremarkable.

Hepatobiliary: No gallstones are noted.  Liver appears normal.

Pancreas: Normal.

Spleen: Normal.

Adrenals/Urinary Tract: Adrenal glands and kidneys appear normal. No
hydronephrosis or renal obstruction is noted. Urinary bladder
appears normal.

Stomach/Bowel: The appendix appears normal. There is no evidence of
bowel obstruction.

Vascular/Lymphatic: No significant adenopathy is noted.

Reproductive: Uterus and left ovary appear normal. 2 cm peripherally
enhancing irregular cyst is noted in right ovary most consistent
with ruptured or involuting cyst.

Other: Mild amount of free fluid is noted in the right adnexal
region and pelvis consistent with ruptured ovarian cyst. There may
be some hemorrhage present in the right adnexal region as well.

Musculoskeletal: No significant osseous abnormality is noted.
IMPRESSION: Probable ruptured or involuting cyst seen in right ovary. Mild
amount of free fluid is noted in the right adnexal region and
pelvis, as well as possible small amount of hemorrhage present in
the right adnexal region. Pelvic ultrasound is recommended for
further evaluation.

## 2018-01-29 IMAGING — US US PELVIS COMPLETE
1 series · 14 of 25 positions shown · non-contrast
Comparison: 01/30/2016 CT abdomen

CLINICAL DATA: Abdominal pain.

EXAM:
TRANSABDOMINAL AND TRANSVAGINAL ULTRASOUND OF PELVIS
TECHNIQUE: Both transabdominal and transvaginal ultrasound examinations of the
pelvis were performed. Transabdominal technique was performed for
global imaging of the pelvis including uterus, ovaries, adnexal
regions, and pelvic cul-de-sac. It was necessary to proceed with
endovaginal exam following the transabdominal exam to visualize the
endometrium and ovaries.

[Series 1: us pelvis complete · 0.22mm/px · 14 of 96 slices shown]
[im 1/96]
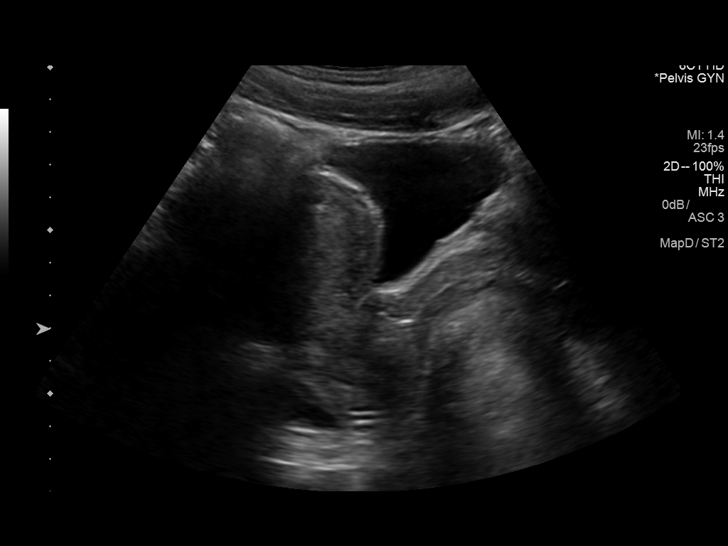
[im 8/96]
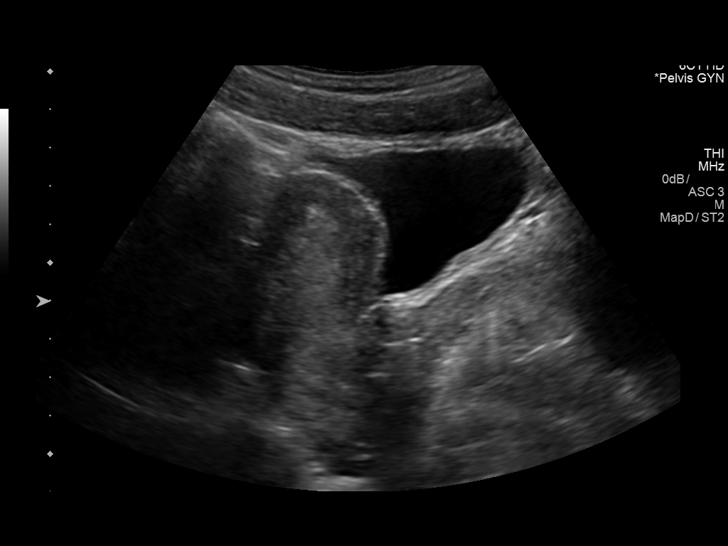
[im 16/96]
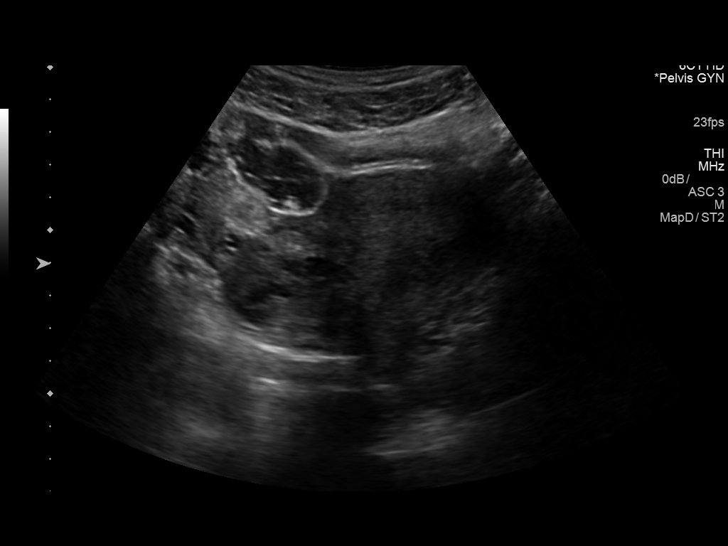
[im 24/96]
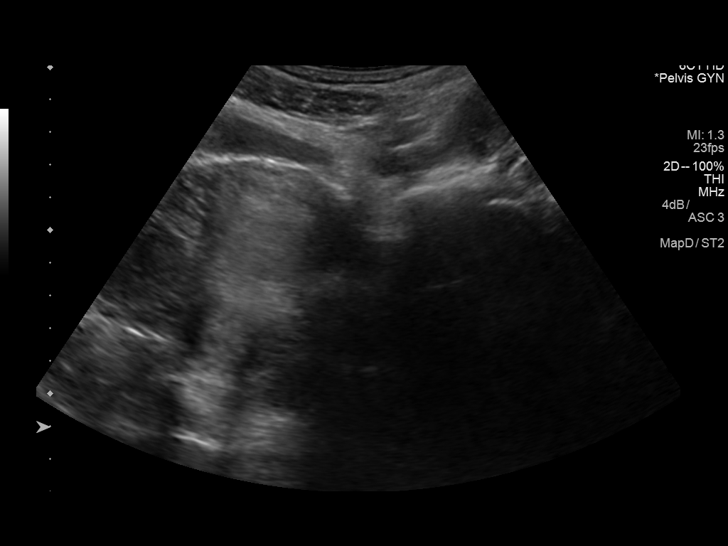
[im 32/96]
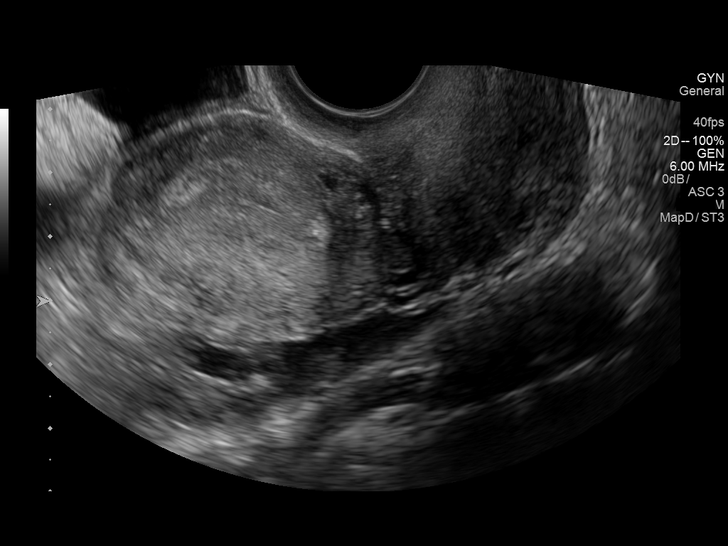
[im 36/96]
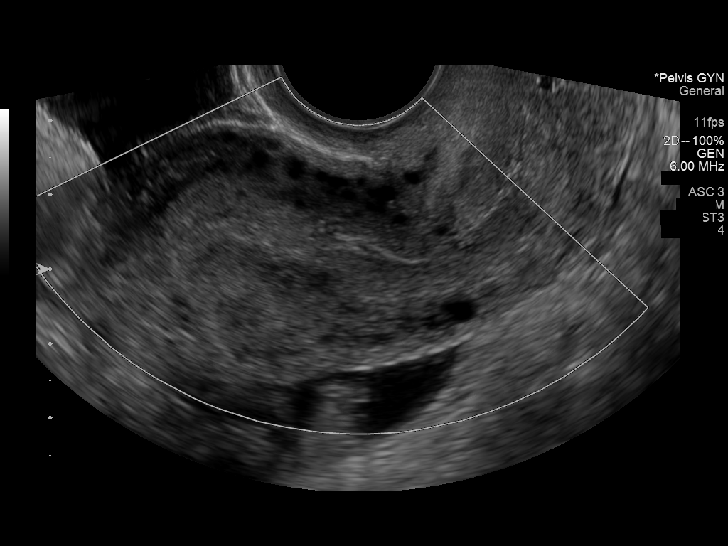
[im 44/96]
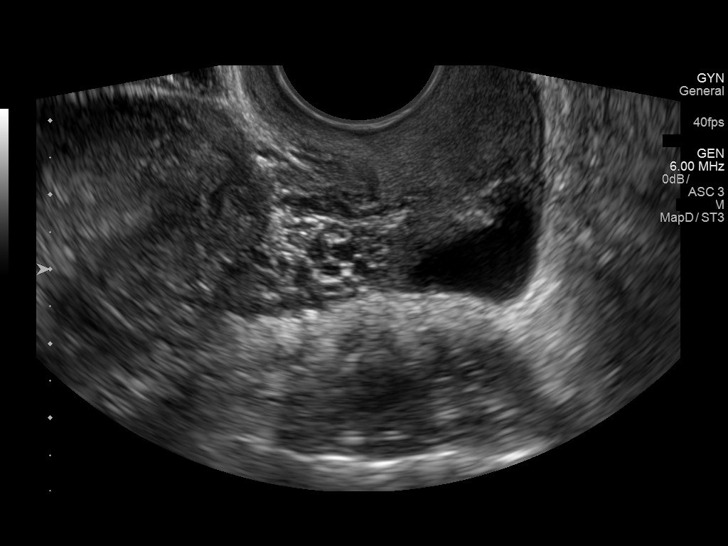
[im 52/96]
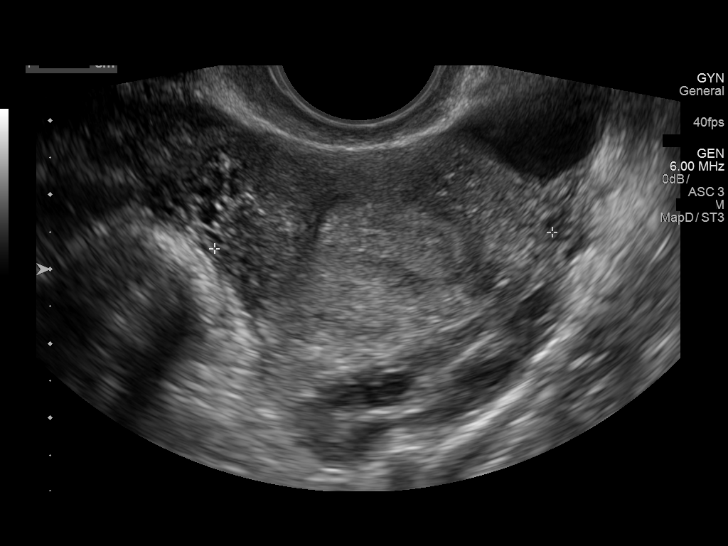
[im 60/96]
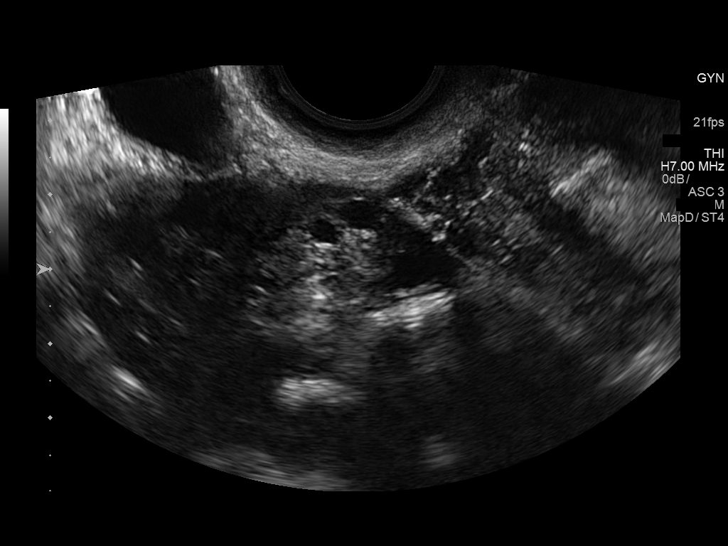
[im 64/96]
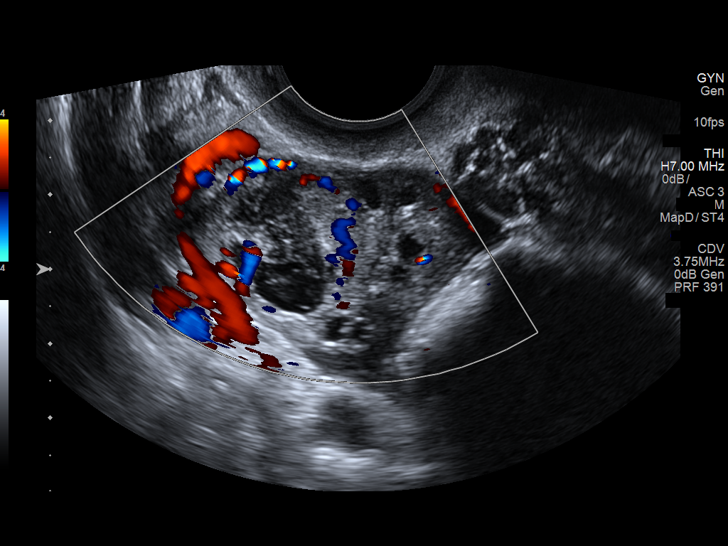
[im 72/96]
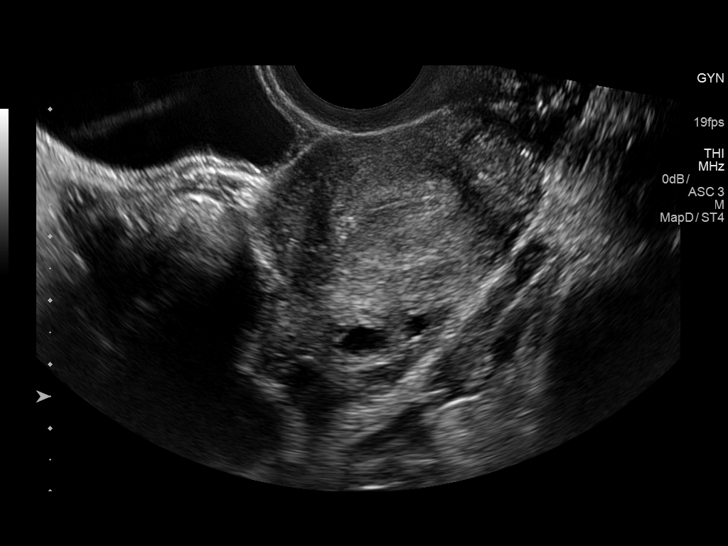
[im 80/96]
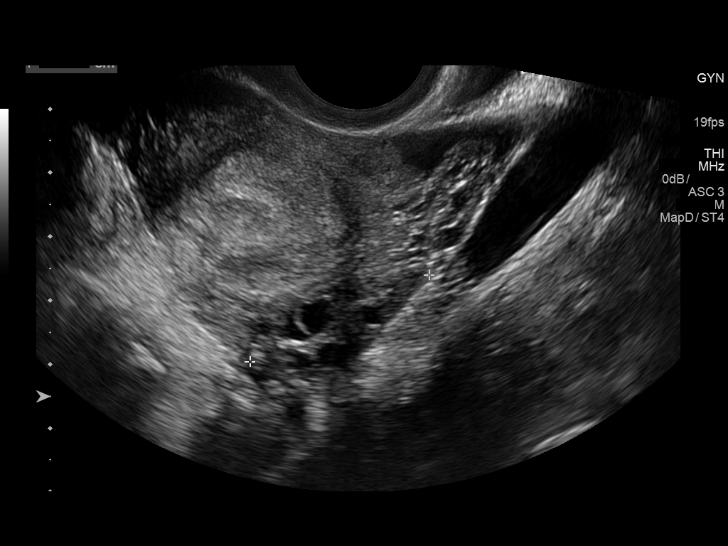
[im 88/96]
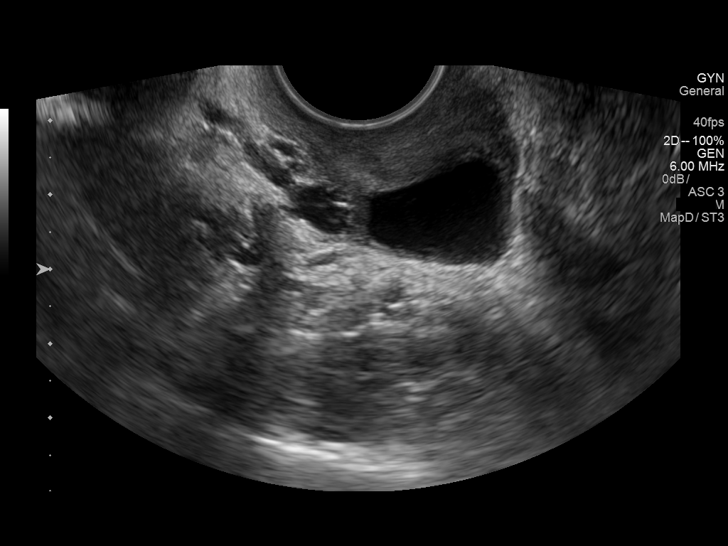
[im 96/96]
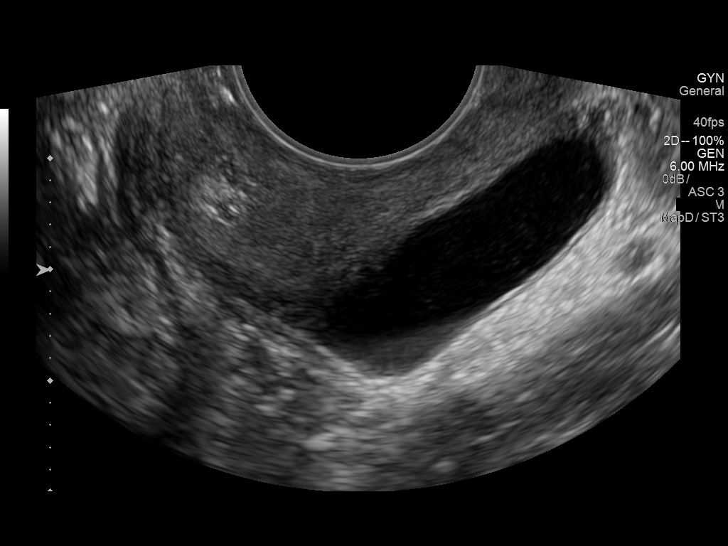

[14 of 25 positions shown; findings below may reference images not displayed]

FINDINGS: Uterus

Measurements: 8 x 3.6 x 4.5 cm. No fibroids or other mass
visualized.

Endometrium

Thickness: 9.6 mm.  No focal abnormality visualized.

Right ovary

Measurements: 3.8 x 2.6 x 3.8 cm. Complex hypoechoic 2.5 x 2.2 x
cm mass in the right ovary without internal Doppler flow likely
reflecting a hemorrhagic cyst or endometrioma.

Left ovary

Measurements: 4.2 x 1.4 x 3.1 cm. Normal appearance/no adnexal mass.

Other findings

Trace pelvic free fluid likely physiologic.
IMPRESSION: 1. Complex hypoechoic 2.5 x 2.2 x 2.1 cm mass in the right ovary
without internal Doppler flow likely reflecting a hemorrhagic cyst
or endometrioma.
